# Patient Record
Sex: Male | Born: 2000 | Race: Black or African American | Hispanic: No | Marital: Single | Smoking: Never smoker
Health system: Southern US, Community
[De-identification: ages and names within clinical notes are randomized; demographics above are authoritative.]

## PROBLEM LIST (undated history)

## (undated) HISTORY — PX: OTHER SURGICAL HISTORY: SHX169

## (undated) HISTORY — PX: EYE SURGERY: SHX253

---
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
## 2022-03-17 ENCOUNTER — Ambulatory Visit (HOSPITAL_COMMUNITY)
Admission: EM | Admit: 2022-03-17 | Discharge: 2022-03-17 | Disposition: A | Discharge status: Home / Self Care | Payer: Medicaid Other | Attending: Internal Medicine | Admitting: Internal Medicine

## 2022-03-17 ENCOUNTER — Encounter (HOSPITAL_COMMUNITY): Payer: Self-pay | Admitting: Emergency Medicine

## 2022-03-17 DIAGNOSIS — Z202 Contact with and (suspected) exposure to infections with a predominantly sexual mode of transmission: Secondary | ICD-10-CM | POA: Insufficient documentation

## 2022-03-17 DIAGNOSIS — R369 Urethral discharge, unspecified: Secondary | ICD-10-CM | POA: Diagnosis not present

## 2022-03-17 DIAGNOSIS — Z113 Encounter for screening for infections with a predominantly sexual mode of transmission: Secondary | ICD-10-CM | POA: Insufficient documentation

## 2022-03-17 DIAGNOSIS — R300 Dysuria: Secondary | ICD-10-CM | POA: Diagnosis not present

## 2022-03-17 LAB — POCT URINALYSIS DIPSTICK, ED / UC
Bilirubin Urine: NEGATIVE
Glucose, UA: NEGATIVE mg/dL
Hgb urine dipstick: NEGATIVE
Ketones, ur: NEGATIVE mg/dL
Leukocytes,Ua: NEGATIVE
Nitrite: NEGATIVE
Protein, ur: 30 mg/dL — AB
Specific Gravity, Urine: >=1.03 (ref 1.005–1.030)
Urobilinogen, UA: 2 mg/dL — ABNORMAL HIGH (ref 0.0–1.0)
pH: 6 (ref 5.0–8.0)

## 2022-03-17 MED ORDER — DOXYCYCLINE HYCLATE 100 MG PO CAPS: 100.0000 mg | ORAL_CAPSULE | Freq: Two times a day (BID) | ORAL | 0 refills | Status: DC

## 2022-03-17 NOTE — ED Provider Notes (Signed)
?[facility] ? ? ? ?CSN: [id number] ?Arrival date & time: 03/17/22  1017 ? ? ?  ? ?History   ?Chief Complaint ?Chief Complaint  ?Patient presents with  ? Penile Discharge  ? Dysuria  ? ? ?HPI ?Phillip Donaldson is a 21 y.o. male.  ? ?Patient presents with penile discharge and urinary burning that has been present for a few days.  Penile discharge is clear in color.  He has recently been exposed to chlamydia from a recent sexual partner a few weeks prior.  Denies testicular pain, abdominal pain, back pain, fever. ? ? ?Penile Discharge ? ?Dysuria ? ?History reviewed. No pertinent past medical history. ? ?There are no problems to display for this patient. ? ? ?Past Surgical History:  ?Procedure Laterality Date  ? EYE SURGERY    ? Left eye    ? ? ? ? ? ?Home Medications   ? ?Prior to Admission medications   ?Medication Sig Start Date End Date Taking? Authorizing Provider  ?doxycycline (VIBRAMYCIN) 100 MG capsule Take 1 capsule (100 mg total) by mouth 2 (two) times daily. 03/17/22  Yes Gustavus Bryant, FNP  ?cyclobenzaprine (FLEXERIL) 10 MG tablet Take 1 tablet (10 mg total) by mouth 2 (two) times daily as needed for muscle spasms. ?Patient not taking: Reported on 05/28/2020 10/02/18   Niel Hummer, MD  ?HYDROcodone-acetaminophen (NORCO/VICODIN) 5-325 MG tablet Take 1 tablet by mouth every 6 (six) hours as needed for moderate pain. 07/20/21   Garlon Hatchet, PA-C  ?ibuprofen (ADVIL) 800 MG tablet Take 1 tablet (800 mg total) by mouth 3 (three) times daily. 07/20/21   Garlon Hatchet, PA-C  ?ondansetron (ZOFRAN-ODT) 4 MG disintegrating tablet Take 1 tablet (4 mg total) by mouth every 8 (eight) hours as needed for nausea or vomiting. 11/13/20   Orma Flaming, NP  ?pantoprazole (PROTONIX) 20 MG tablet Take 1 tablet (20 mg total) by mouth daily for 14 days. 05/28/20 06/11/20  Sabino Donovan, MD  ? ? ?Family History ?Family History  ?Family history unknown: Yes  ? ? ?Social History ?Social History  ? ?Tobacco Use  ? Smoking status:  Never  ? Smokeless tobacco: Never  ?Substance Use Topics  ? Alcohol use: No  ? Drug use: No  ? ? ? ?Allergies   ?Patient has no known allergies. ? ? ?Review of Systems ?Review of Systems ?Per HPI ? ?Physical Exam ?Triage Vital Signs ?ED Triage Vitals  ?Enc Vitals Group  ?   BP 03/17/22 1122 119/76  ?   Pulse Rate 03/17/22 1122 75  ?   Resp 03/17/22 1122 17  ?   Temp 03/17/22 1122 98 ?F (36.7 ?C)  ?   Temp src --   ?   SpO2 03/17/22 1122 98 %  ?   Weight --   ?   Height --   ?   Head Circumference --   ?   Peak Flow --   ?   Pain Score 03/17/22 1121 0  ?   Pain Loc --   ?   Pain Edu? --   ?   Excl. in GC? --   ? ?No data found. ? ?Updated Vital Signs ?BP 119/76   Pulse 75   Temp 98 ?F (36.7 ?C)   Resp 17   SpO2 98%  ? ?Visual Acuity ?Right Eye Distance:   ?Left Eye Distance:   ?Bilateral Distance:   ? ?Right Eye Near:   ?Left Eye Near:    ?  Bilateral Near:    ? ?Physical Exam ?Constitutional:   ?   General: He is not in acute distress. ?   Appearance: Normal appearance. He is not toxic-appearing or diaphoretic.  ?HENT:  ?   Head: Normocephalic and atraumatic.  ?Eyes:  ?   Extraocular Movements: Extraocular movements intact.  ?   Conjunctiva/sclera: Conjunctivae normal.  ?Pulmonary:  ?   Effort: Pulmonary effort is normal.  ?Genitourinary: ?   Comments: Deferred with shared decision making.  Self swab performed. ?Neurological:  ?   General: No focal deficit present.  ?   Mental Status: He is alert and oriented to person, place, and time. Mental status is at baseline.  ?Psychiatric:     ?   Mood and Affect: Mood normal.     ?   Behavior: Behavior normal.     ?   Thought Content: Thought content normal.     ?   Judgment: Judgment normal.  ? ? ? ?UC Treatments / Results  ?Labs ?(all labs ordered are listed, but only abnormal results are displayed) ?Labs Reviewed  ?POCT URINALYSIS DIPSTICK, ED / UC - Abnormal; Notable for the following components:  ?    Result Value  ? Protein, ur 30 (*)   ? Urobilinogen, UA 2.0 (*)    ? All other components within normal limits  ?CYTOLOGY, (ORAL, ANAL, URETHRAL) ANCILLARY ONLY  ? ? ?EKG ? ? ?Radiology ?No results found. ? ?Procedures ?Procedures (including critical care time) ? ?Medications Ordered in UC ?Medications - No data to display ? ?Initial Impression / Assessment and Plan / UC Course  ?I have reviewed the triage vital signs and the nursing notes. ? ?Pertinent labs & imaging results that were available during my care of the patient were reviewed by me and considered in my medical decision making (see chart for details). ? ?  ? ?Urinalysis not showing urinary tract infection.  Low suspicion for this as patient has recently been exposed to chlamydia.  Cytology swab pending.  Will prophylactically treat with doxycycline given recent chlamydia exposure.  Urinalysis indicating possible low water intake but no other worrisome concerns at this time.  Patient to refrain from sexual activity until test results and treatment are complete.  Patient verbalized understanding and was agreeable with plan. ?Final Clinical Impressions(s) / UC Diagnoses  ? ?Final diagnoses:  ?Dysuria  ?Penile discharge  ?STD exposure  ?Screening examination for venereal disease  ? ? ? ?Discharge Instructions   ? ?  ?You have been prophylactically treated for chlamydia exposure with doxycycline antibiotic.  Please take this medication with food.  We will call if your penile swab is positive.  Please refrain from sexual activity until test results and treatment are complete. ? ? ? ?ED Prescriptions   ? ? Medication Sig Dispense Auth. Provider  ? doxycycline (VIBRAMYCIN) 100 MG capsule Take 1 capsule (100 mg total) by mouth 2 (two) times daily. 20 capsule Ervin Knack E, [place]  ? ?  ? ?PDMP not reviewed this encounter. ?  ?Gustavus Bryant, [place] ?03/17/22 1153 ? ?

## 2022-03-17 NOTE — Discharge Instructions (Addendum)
You have been prophylactically treated for chlamydia exposure with doxycycline antibiotic.  Please take this medication with food.  We will call if your penile swab is positive.  Please refrain from sexual activity until test results and treatment are complete. ?

## 2022-03-17 NOTE — ED Triage Notes (Signed)
Pt is present today with concerns for penile discharge and dysuria. Pt states he cannot remember when the penile discharge started but the dysuria started today. Pt recently exposed to chlamydia.  ?

## 2022-03-18 LAB — CYTOLOGY, (ORAL, ANAL, URETHRAL) ANCILLARY ONLY
Chlamydia: POSITIVE — AB
Comment: NEGATIVE
Comment: NEGATIVE
Comment: NORMAL
Neisseria Gonorrhea: NEGATIVE
Trichomonas: NEGATIVE

## 2022-04-04 ENCOUNTER — Emergency Department (HOSPITAL_COMMUNITY): Payer: Medicaid Other

## 2022-04-04 ENCOUNTER — Encounter (HOSPITAL_COMMUNITY): Payer: Self-pay

## 2022-04-04 ENCOUNTER — Emergency Department (HOSPITAL_COMMUNITY)
Admission: EM | Admit: 2022-04-04 | Discharge: 2022-04-04 | Disposition: A | Discharge status: Home / Self Care | Payer: Medicaid Other | Attending: Emergency Medicine | Admitting: Emergency Medicine

## 2022-04-04 DIAGNOSIS — R059 Cough, unspecified: Secondary | ICD-10-CM | POA: Insufficient documentation

## 2022-04-04 DIAGNOSIS — M791 Myalgia, unspecified site: Secondary | ICD-10-CM | POA: Insufficient documentation

## 2022-04-04 DIAGNOSIS — R112 Nausea with vomiting, unspecified: Secondary | ICD-10-CM | POA: Insufficient documentation

## 2022-04-04 DIAGNOSIS — R21 Rash and other nonspecific skin eruption: Secondary | ICD-10-CM | POA: Diagnosis not present

## 2022-04-04 DIAGNOSIS — M545 Low back pain, unspecified: Secondary | ICD-10-CM

## 2022-04-04 DIAGNOSIS — M546 Pain in thoracic spine: Secondary | ICD-10-CM | POA: Insufficient documentation

## 2022-04-04 DIAGNOSIS — Z79899 Other long term (current) drug therapy: Secondary | ICD-10-CM | POA: Diagnosis not present

## 2022-04-04 DIAGNOSIS — M79606 Pain in leg, unspecified: Secondary | ICD-10-CM | POA: Diagnosis not present

## 2022-04-04 DIAGNOSIS — R079 Chest pain, unspecified: Secondary | ICD-10-CM | POA: Insufficient documentation

## 2022-04-04 DIAGNOSIS — R109 Unspecified abdominal pain: Secondary | ICD-10-CM | POA: Insufficient documentation

## 2022-04-04 LAB — URINALYSIS, ROUTINE W REFLEX MICROSCOPIC
Bilirubin Urine: NEGATIVE
Glucose, UA: NEGATIVE mg/dL
Hgb urine dipstick: NEGATIVE
Ketones, ur: NEGATIVE mg/dL
Leukocytes,Ua: NEGATIVE
Nitrite: NEGATIVE
Protein, ur: NEGATIVE mg/dL
Specific Gravity, Urine: 1.01 (ref 1.005–1.030)
pH: 5 (ref 5.0–8.0)

## 2022-04-04 LAB — CBC WITH DIFFERENTIAL/PLATELET
Abs Immature Granulocytes: 0.01 10*3/uL (ref 0.00–0.07)
Basophils Absolute: 0 10*3/uL (ref 0.0–0.1)
Basophils Relative: 0 %
Eosinophils Absolute: 0.1 10*3/uL (ref 0.0–0.5)
Eosinophils Relative: 1 %
HCT: 45.1 % (ref 39.0–52.0)
Hemoglobin: 15.2 g/dL (ref 13.0–17.0)
Immature Granulocytes: 0 %
Lymphocytes Relative: 12 %
Lymphs Abs: 0.8 10*3/uL (ref 0.7–4.0)
MCH: 29.5 pg (ref 26.0–34.0)
MCHC: 33.7 g/dL (ref 30.0–36.0)
MCV: 87.6 fL (ref 80.0–100.0)
Monocytes Absolute: 0.6 10*3/uL (ref 0.1–1.0)
Monocytes Relative: 9 %
Neutro Abs: 5.1 10*3/uL (ref 1.7–7.7)
Neutrophils Relative %: 78 %
Platelets: 178 10*3/uL (ref 150–400)
RBC: 5.15 MIL/uL (ref 4.22–5.81)
RDW: 13.8 % (ref 11.5–15.5)
WBC: 6.6 10*3/uL (ref 4.0–10.5)
nRBC: 0 % (ref 0.0–0.2)

## 2022-04-04 LAB — COMPREHENSIVE METABOLIC PANEL
ALT: 41 U/L (ref 0–44)
AST: 24 U/L (ref 15–41)
Albumin: 4.2 g/dL (ref 3.5–5.0)
Alkaline Phosphatase: 54 U/L (ref 38–126)
Anion gap: 8 (ref 5–15)
BUN: 11 mg/dL (ref 6–20)
CO2: 24 mmol/L (ref 22–32)
Calcium: 9.2 mg/dL (ref 8.9–10.3)
Chloride: 107 mmol/L (ref 98–111)
Creatinine, Ser: 1.13 mg/dL (ref 0.61–1.24)
GFR, Estimated: >60 mL/min (ref >60)
Glucose, Bld: 101 mg/dL — ABNORMAL HIGH (ref 70–99)
Potassium: 3.7 mmol/L (ref 3.5–5.1)
Sodium: 139 mmol/L (ref 135–145)
Total Bilirubin: 0.9 mg/dL (ref 0.3–1.2)
Total Protein: 8 g/dL (ref 6.5–8.1)

## 2022-04-04 LAB — RAPID URINE DRUG SCREEN, HOSP PERFORMED
Amphetamines: POSITIVE — AB
Barbiturates: NOT DETECTED
Benzodiazepines: NOT DETECTED
Cocaine: NOT DETECTED
Opiates: NOT DETECTED
Tetrahydrocannabinol: POSITIVE — AB

## 2022-04-04 LAB — LIPASE, BLOOD: Lipase: 28 U/L (ref 11–51)

## 2022-04-04 LAB — CK: Total CK: 179 U/L (ref 49–397)

## 2022-04-04 MED ORDER — SODIUM CHLORIDE 0.9 % IV BOLUS
1000.0000 mL | Freq: Once | INTRAVENOUS | Status: AC
  Administered 2022-04-04: 1000 mL via INTRAVENOUS

## 2022-04-04 MED ORDER — ONDANSETRON 4 MG PO TBDP
4.0000 mg | ORAL_TABLET | Freq: Once | ORAL | Status: AC
  Administered 2022-04-04: 4 mg via ORAL
  Filled 2022-04-04: qty 1

## 2022-04-04 MED ORDER — ONDANSETRON 4 MG PO TBDP: 4.0000 mg | ORAL_TABLET | Freq: Three times a day (TID) | ORAL | 0 refills | Status: DC | PRN

## 2022-04-04 MED ORDER — IOHEXOL 300 MG/ML  SOLN
100.0000 mL | Freq: Once | INTRAMUSCULAR | Status: AC | PRN
  Administered 2022-04-04: 80 mL via INTRAVENOUS

## 2022-04-04 MED ORDER — KETOROLAC TROMETHAMINE 30 MG/ML IJ SOLN
30.0000 mg | Freq: Once | INTRAMUSCULAR | Status: AC
  Administered 2022-04-04: 30 mg via INTRAVENOUS
  Filled 2022-04-04: qty 1

## 2022-04-04 MED ORDER — PANTOPRAZOLE SODIUM 40 MG IV SOLR
40.0000 mg | Freq: Once | INTRAVENOUS | Status: AC
  Administered 2022-04-04: 40 mg via INTRAVENOUS
  Filled 2022-04-04: qty 10

## 2022-04-04 NOTE — ED Triage Notes (Addendum)
Per EMS, patient from home, c/o SOB with vomiting and back pain for two months. Denies fevers. EMS reports refused IV access for Zofran administration.

## 2022-04-04 NOTE — ED Notes (Signed)
Pt family member states she brought pt to the ED today because he has had back pain and fatigue for years, spots on his back and neck, decreased PO intake and that he just does not feel well. Pt not cooperative in answering questions.

## 2022-04-04 NOTE — ED Notes (Signed)
Pt unable to provide urine sample at this time 

## 2022-04-04 NOTE — ED Provider Notes (Signed)
[facility]-EMERGENCY DEPT Provider Note   CSN: [id number] Arrival date & time: 04/04/22  1306     History {Add pertinent medical, surgical, social history, OB history to HPI:1} Chief Complaint  Patient presents with   Cough    Flu like sx   Back Pain    Phillip Donaldson is a 21 y.o. male.  He is brought in by his mother for evaluation of generalized body pain, back pain abdominal pain nausea vomiting pain in his legs that been going on for years.  He is not participating much in the history.  She says he has good days and bad days and this was a bad day so she called the ambulance to bring him appear.  No known fevers or chills.  He does smoke marijuana denies any injection drugs.  Does not have a doctor and does not take any medications regularly.  She said he is able to hold down a job.  He rates his generalized pain as 8 out of 10.  Has tried nothing for it  The history is provided by the patient.  Back Pain Location:  Generalized Pain severity:  Severe Pain is:  Same all the time Onset quality:  Gradual Duration: Years. Timing:  Intermittent Progression:  Unchanged Context: not recent injury   Relieved by:  Nothing Associated symptoms: abdominal pain, chest pain and leg pain   Associated symptoms: no fever   Abdominal pain:    Location:  Generalized   Timing:  Intermittent   Progression:  Unchanged     Home Medications Prior to Admission medications   Medication Sig Start Date End Date Taking? Authorizing Provider  cyclobenzaprine (FLEXERIL) 10 MG tablet Take 1 tablet (10 mg total) by mouth 2 (two) times daily as needed for muscle spasms. Patient not taking: Reported on 05/28/2020 10/02/18   Niel HummerKuhner, Ross, MD  doxycycline (VIBRAMYCIN) 100 MG capsule Take 1 capsule (100 mg total) by mouth 2 (two) times daily. 03/17/22   Gustavus BryantMound, Haley E, FNP  HYDROcodone-acetaminophen (NORCO/VICODIN) 5-325 MG tablet Take 1 tablet by mouth every 6 (six) hours as needed for  moderate pain. 07/20/21   Garlon HatchetSanders, Lisa M, PA-C  ibuprofen (ADVIL) 800 MG tablet Take 1 tablet (800 mg total) by mouth 3 (three) times daily. 07/20/21   Garlon HatchetSanders, Lisa M, PA-C  ondansetron (ZOFRAN-ODT) 4 MG disintegrating tablet Take 1 tablet (4 mg total) by mouth every 8 (eight) hours as needed for nausea or vomiting. 11/13/20   Orma FlamingHouk, Taylor R, NP  pantoprazole (PROTONIX) 20 MG tablet Take 1 tablet (20 mg total) by mouth daily for 14 days. 05/28/20 06/11/20  Sabino DonovanKatz, Eric C, MD      Allergies    Patient has no known allergies.    Review of Systems   Review of Systems  Constitutional:  Positive for fatigue. Negative for fever.  Eyes:  Negative for visual disturbance.  Respiratory:  Positive for shortness of breath.   Cardiovascular:  Positive for chest pain.  Gastrointestinal:  Positive for abdominal pain, nausea and vomiting.  Musculoskeletal:  Positive for back pain.  Skin:  Positive for rash.  Neurological:  Negative for seizures.   Physical Exam Updated Vital Signs BP 95/60   Pulse 74   Temp 98.6 F (37 C) (Oral)   Resp 18   SpO2 99%  Physical Exam Vitals and nursing note reviewed.  Constitutional:      General: He is not in acute distress.    Appearance: Normal appearance. He is  well-developed.  HENT:     Head: Normocephalic and atraumatic.  Eyes:     Conjunctiva/sclera: Conjunctivae normal.  Cardiovascular:     Rate and Rhythm: Normal rate and regular rhythm.     Heart sounds: No murmur heard. Pulmonary:     Effort: Pulmonary effort is normal. No respiratory distress.     Breath sounds: Normal breath sounds.  Abdominal:     Palpations: Abdomen is soft.     Tenderness: There is no abdominal tenderness. There is no guarding or rebound.  Musculoskeletal:        General: No deformity. Normal range of motion.     Cervical back: Neck supple.     Right lower leg: No edema.     Left lower leg: No edema.  Skin:    General: Skin is warm and dry.     Capillary Refill: Capillary  refill takes less than 2 seconds.     Findings: Rash present.     Comments: He has multiple dark patches over his neck and trunk.  Neurological:     General: No focal deficit present.    ED Results / Procedures / Treatments   Labs (all labs ordered are listed, but only abnormal results are displayed) Labs Reviewed  COMPREHENSIVE METABOLIC PANEL - Abnormal; Notable for the following components:      Result Value   Glucose, Bld 101 (*)    All other components within normal limits  CBC WITH DIFFERENTIAL/PLATELET  URINALYSIS, ROUTINE W REFLEX MICROSCOPIC    EKG None  Radiology DG Chest 2 View  Result Date: 04/04/2022 CLINICAL DATA:  Shortness of breath. Chest and back pain. Vomiting. EXAM: CHEST - 2 VIEW COMPARISON:  10/01/2018 FINDINGS: The heart size and mediastinal contours are within normal limits. Both lungs are clear. The visualized skeletal structures are unremarkable. IMPRESSION: Normal exam. Electronically Signed   By: Danae Orleans M.D.   On: 04/04/2022 13:57   CT Thoracic Spine Wo Contrast  Result Date: 04/04/2022 CLINICAL DATA:  Mid back pain, infection suspected. EXAM: CT THORACIC SPINE WITHOUT CONTRAST TECHNIQUE: Multidetector CT images of the thoracic were obtained using the standard protocol without intravenous contrast. RADIATION DOSE REDUCTION: This exam was performed according to the departmental dose-optimization program which includes automated exposure control, adjustment of the mA and/or kV according to patient size and/or use of iterative reconstruction technique. COMPARISON:  None Available. FINDINGS: Alignment: Normal. Vertebrae: No acute fracture or focal pathologic process. Paraspinal and other soft tissues: Negative. Disc levels: Disc spaces are maintained. No evidence of discitis osteomyelitis. No spinal canal or neural foraminal stenosis. IMPRESSION: Normal examination. Electronically Signed   By: Larose Hires D.O.   On: 04/04/2022 16:30   CT Lumbar Spine Wo  Contrast  Result Date: 04/04/2022 CLINICAL DATA:  Low back pain, infection suspected. No prior imaging. EXAM: CT LUMBAR SPINE WITHOUT CONTRAST TECHNIQUE: Multidetector CT imaging of the lumbar spine was performed without intravenous contrast administration. Multiplanar CT image reconstructions were also generated. RADIATION DOSE REDUCTION: This exam was performed according to the departmental dose-optimization program which includes automated exposure control, adjustment of the mA and/or kV according to patient size and/or use of iterative reconstruction technique. COMPARISON:  None Available. FINDINGS: Evaluation of the osseous structures and soft tissues is significantly limited at lumbosacral junction due to motion. Segmentation: 5 lumbar type vertebrae. Alignment: Normal. Vertebrae: No acute fracture or focal pathologic process. Evaluation at the lumbosacral junction is significantly limited due to motion. Paraspinal and other soft tissues: Negative.  Disc levels: No significant degenerate disc disease. IMPRESSION: 1. Evaluation of osseous structures and soft tissues is significantly limited at lumbosacral junction. If there is localized pain or clinical concern, repeat examination could be considered for further evaluation. 2.  No appreciable acute abnormality of the lumbar spine. Electronically Signed   By: Larose Hires D.O.   On: 04/04/2022 16:36    Procedures Procedures  {Document cardiac monitor, telemetry assessment procedure when appropriate:1}  Medications Ordered in ED Medications  sodium chloride 0.9 % bolus 1,000 mL (has no administration in time range)  pantoprazole (PROTONIX) injection 40 mg (has no administration in time range)  ondansetron (ZOFRAN-ODT) disintegrating tablet 4 mg (4 mg Oral Given 04/04/22 1412)    ED Course/ Medical Decision Making/ A&P                           Medical Decision Making Amount and/or Complexity of Data Reviewed Labs: ordered. Radiology:  ordered.  Risk Prescription drug management.  This patient complains of ***; this involves an extensive number of treatment Options and is a complaint that carries with it a high risk of complications and morbidity. The differential includes ***  I ordered, reviewed and interpreted labs, which included *** I ordered medication *** and reviewed PMP when indicated. I ordered imaging studies which included *** and I independently    visualized and interpreted imaging which showed *** Additional history obtained from *** Previous records obtained and reviewed *** I consulted *** and discussed lab and imaging findings and discussed disposition.  Cardiac monitoring reviewed, *** Social determinants considered, *** Critical Interventions: ***  After the interventions stated above, I reevaluated the patient and found *** Admission and further testing considered, ***    {Document critical care time when appropriate:1} {Document review of labs and clinical decision tools ie heart score, Chads2Vasc2 etc:1}  {Document your independent review of radiology images, and any outside records:1} {Document your discussion with family members, caretakers, and with consultants:1} {Document social determinants of health affecting pt's care:1} {Document your decision making why or why not admission, treatments were needed:1} Final Clinical Impression(s) / ED Diagnoses Final diagnoses:  None    Rx / DC Orders ED Discharge Orders     None

## 2022-04-04 NOTE — ED Provider Triage Note (Signed)
Emergency Medicine Provider Triage Evaluation Note  Phillip Donaldson , a 21 y.o. male  was evaluated in triage.  Pt complains of 2 to 4 weeks of mid to lower back pain followed by 1 week of abdominal pain with N/V and fever/chills.  Back pain started suddenly and increase gradually.  Was not preceded by any mechanical or traumatic injury.  Has been unable to keep down foods and fluids for almost a week.  Denies diarrhea, constipation, chest pain, shortness of breath, headaches.  Also feels nauseous when standing or walking, and complains of left-sided upper and lower extremity weakness.  Denies recent IVDU or Hx of cancer.  Hx of current marijuana use.  No Hx of abdominal, back, or recent surgeries.    Review of Systems  Positive:  Negative: As above  Physical Exam  BP 105/68   Pulse 95   Temp 98.6 F (37 C) (Oral)   Resp 18   SpO2 100%  Gen:   Awake, no distress, sitting in wheelchair and mildly ill-appearing Resp:  Normal effort, CTAB MSK:   Mild decreased strength of the left lower and left upper extremity.  Sensation intact to all extremities bilaterally.  Coordination appears grossly intact.  Unable to assess gait due to severe nausea.  Abdomen soft, tender in the large circumference around the umbilicus.  No obvious rash.  Moderate to significant midline tenderness near the T10-L3 region. Other:  No pronator drift, abnormal EOMs, or nystagmus.  Facial asymmetry, pre-existing due to eye surgery on the left eye several years ago.  CRT less than 2 seconds in all extremities bilaterally.  Medical Decision Making  Medically screening exam initiated at 1:55 PM.  Appropriate orders placed.  Thanh Herro was informed that the remainder of the evaluation will be completed by another provider, this initial triage assessment does not replace that evaluation, and the importance of remaining in the ED until their evaluation is complete.  Labs, imaging, EKG, and Zofran ordered   Cecil Cobbs,  PA-C 04/04/22 1414

## 2022-04-04 NOTE — Discharge Instructions (Signed)
You were seen in the emergency department for ongoing back pain nausea vomiting.  You had lab work that was unremarkable along with a CAT scan of your back and a CAT scan of your abdomen.  It would be important for you to establish care with a primary care doctor.  There is a number on this paperwork to try to find a primary care doctor.  We are prescribing you some nausea medication that may help your symptoms.  Your symptoms may be related to marijuana use and it may be reasonable to try to cut back and stop.  Return to the emergency department if any high fevers or worsening symptoms.

## 2022-04-05 LAB — RHEUMATOID FACTOR: Rheumatoid fact SerPl-aCnc: <10 IU/mL (ref <14.0)

## 2022-04-06 LAB — LUPUS ANTICOAGULANT PANEL
DRVVT: 44.2 s (ref 0.0–47.0)
PTT Lupus Anticoagulant: 36.6 s (ref 0.0–43.5)

## 2022-10-01 ENCOUNTER — Emergency Department (HOSPITAL_COMMUNITY)
Admission: EM | Admit: 2022-10-01 | Discharge: 2022-10-01 | Disposition: A | Discharge status: Home / Self Care | Payer: Medicaid Other | Attending: Emergency Medicine | Admitting: Emergency Medicine

## 2022-10-01 DIAGNOSIS — R519 Headache, unspecified: Secondary | ICD-10-CM | POA: Insufficient documentation

## 2022-10-01 DIAGNOSIS — M791 Myalgia, unspecified site: Secondary | ICD-10-CM | POA: Insufficient documentation

## 2022-10-01 DIAGNOSIS — R059 Cough, unspecified: Secondary | ICD-10-CM | POA: Diagnosis present

## 2022-10-01 DIAGNOSIS — R0981 Nasal congestion: Secondary | ICD-10-CM | POA: Insufficient documentation

## 2022-10-01 DIAGNOSIS — R509 Fever, unspecified: Secondary | ICD-10-CM | POA: Diagnosis not present

## 2022-10-01 DIAGNOSIS — J029 Acute pharyngitis, unspecified: Secondary | ICD-10-CM | POA: Insufficient documentation

## 2022-10-01 DIAGNOSIS — Z20822 Contact with and (suspected) exposure to covid-19: Secondary | ICD-10-CM | POA: Diagnosis not present

## 2022-10-01 LAB — GROUP A STREP BY PCR: Group A Strep by PCR: NOT DETECTED

## 2022-10-01 LAB — RESP PANEL BY RT-PCR (FLU A&B, COVID) ARPGX2
Influenza A by PCR: NEGATIVE
Influenza B by PCR: NEGATIVE
SARS Coronavirus 2 by RT PCR: NEGATIVE

## 2022-10-01 MED ORDER — IBUPROFEN 800 MG PO TABS
800.0000 mg | ORAL_TABLET | Freq: Once | ORAL | Status: AC
  Administered 2022-10-01: 800 mg via ORAL
  Filled 2022-10-01: qty 1

## 2022-10-01 NOTE — Discharge Instructions (Signed)
Evaluation of your cough, congestion sore throat was overall reassuring.  You do not have COVID, flu or strep throat.  You likely do have a viral upper respiratory infection which is causing the symptoms.  Symptoms could last up to 2 weeks.  Recommend conservative treatment at home which includes rest, good hydration and continued nutrition with balanced diet.  Recommend taking Tylenol and ibuprofen as needed for pain.  If you have new shortness of breath, chest pain, trouble swallowing or new drooling please return to the emergency department for further evaluation.

## 2022-10-01 NOTE — ED Triage Notes (Signed)
Patient complains of nasal congestion, fever, and generalized body aches that started two days ago.

## 2022-10-01 NOTE — ED Provider Notes (Signed)
[facility] EMERGENCY DEPARTMENT Provider Note   CSN: [id number] Arrival date & time: 10/01/22  2440     History  Chief Complaint  Patient presents with   Fever   HPI Phillip Donaldson is a 21 y.o. male presenting for congestion and cough.  States his symptoms started 2 days ago along with a headache, sore throat and bodyaches.  States his sore throat since subsided but symptoms persist.  Cough is productive of clear to yellow sputum.  Also endorses congestion.  Headache is only mild with a gradual onset.  Denies sick contacts and fever.  States that he has been taking ibuprofen and NyQuil for symptoms.  Persistence of his symptoms prompted his evaluation today in the emergency department.   Fever Associated symptoms: congestion, cough and headaches        Home Medications Prior to Admission medications   Medication Sig Start Date End Date Taking? Authorizing Provider  cyclobenzaprine (FLEXERIL) 10 MG tablet Take 1 tablet (10 mg total) by mouth 2 (two) times daily as needed for muscle spasms. Patient not taking: Reported on 05/28/2020 10/02/18   Niel Hummer, MD  doxycycline (VIBRAMYCIN) 100 MG capsule Take 1 capsule (100 mg total) by mouth 2 (two) times daily. 03/17/22   Gustavus Bryant, FNP  HYDROcodone-acetaminophen (NORCO/VICODIN) 5-325 MG tablet Take 1 tablet by mouth every 6 (six) hours as needed for moderate pain. 07/20/21   Garlon Hatchet, PA-C  ibuprofen (ADVIL) 800 MG tablet Take 1 tablet (800 mg total) by mouth 3 (three) times daily. 07/20/21   Garlon Hatchet, PA-C  ondansetron (ZOFRAN-ODT) 4 MG disintegrating tablet Take 1 tablet (4 mg total) by mouth every 8 (eight) hours as needed for nausea or vomiting. 04/04/22   Terrilee Files, MD  pantoprazole (PROTONIX) 20 MG tablet Take 1 tablet (20 mg total) by mouth daily for 14 days. 05/28/20 06/11/20  Sabino Donovan, MD      Allergies    Patient has no known allergies.    Review of Systems   Review of Systems   HENT:  Positive for congestion.   Respiratory:  Positive for cough.   Neurological:  Positive for headaches.    Physical Exam Updated Vital Signs BP (!) 124/99 (BP Location: Right Arm)   Pulse 84   Temp 99.1 F (37.3 C) (Oral)   Resp 16   SpO2 100%  Physical Exam Vitals and nursing note reviewed.  HENT:     Head: Normocephalic and atraumatic.     Mouth/Throat:     Mouth: Mucous membranes are moist.     Pharynx: Pharyngeal swelling, posterior oropharyngeal erythema and uvula swelling present.  Eyes:     General:        Right eye: No discharge.        Left eye: No discharge.     Conjunctiva/sclera: Conjunctivae normal.  Cardiovascular:     Rate and Rhythm: Normal rate and regular rhythm.     Pulses: Normal pulses.     Heart sounds: Normal heart sounds.  Pulmonary:     Effort: Pulmonary effort is normal.     Breath sounds: Normal breath sounds. No decreased breath sounds, wheezing, rhonchi or rales.  Abdominal:     General: Abdomen is flat.     Palpations: Abdomen is soft.  Skin:    General: Skin is warm and dry.  Neurological:     General: No focal deficit present.  Psychiatric:  Mood and Affect: Mood normal.     ED Results / Procedures / Treatments   Labs (all labs ordered are listed, but only abnormal results are displayed) Labs Reviewed  RESP PANEL BY RT-PCR (FLU A&B, COVID) [id number]  GROUP A STREP BY PCR    EKG None  Radiology No results found.  Procedures Procedures    Medications Ordered in ED Medications  ibuprofen (ADVIL) tablet 800 mg (800 mg Oral Given 10/01/22 1140)    ED Course/ Medical Decision Making/ A&P                           Medical Decision Making Risk Prescription drug management.   Patient presented for 2-day history of congestion, cough and sore throat.  Overall patient appears nontoxic and is hemodynamically stable.  Differential diagnosis for this complaint includes URI, pneumonia, and pharyngitis.  Considered  pneumonia but unlikely given no adventitious lung sounds on exam and no fever.  Considered strep pharyngitis given erythema and swelling noted in the posterior pharynx but unlikely given negative strep PCR.  Symptoms could be related to a viral pharyngitis but patient stated that his sore throat is improving.  Overall consistent with likely ongoing URI likely viral in nature.  Recommended conservative treatment at home.  Discussed return precautions and advised patient to follow-up with PCP if symptoms persisted.  Treated his pain with ibuprofen.  Patient stated he felt about the same.        Final Clinical Impression(s) / ED Diagnoses Final diagnoses:  Cough, unspecified type  Nasal congestion  Sore throat    Rx / DC Orders ED Discharge Orders     None         Gareth Eagle, PA-C 10/01/22 1240    Arby Barrette, MD 10/02/22 [phone]

## 2023-03-23 IMAGING — CT CT T SPINE W/O CM
3 series · 13 of 33 positions shown, 16 images · non-contrast
Comparison: None Available.

CLINICAL DATA: Mid back pain, infection suspected.

EXAM:
CT THORACIC SPINE WITHOUT CONTRAST
TECHNIQUE: Multidetector CT images of the thoracic were obtained using the
standard protocol without intravenous contrast.
RADIATION DOSE REDUCTION: This exam was performed according to the
departmental dose-optimization program which includes automated
exposure control, adjustment of the mA and/or kV according to
patient size and/or use of iterative reconstruction technique.

[Series 4: t spine st · axial · 0.43mm/px · z∈[-276,-50]mm · 5 of 165 slices shown, 7 images]
[im 26/165  soft-tissue]
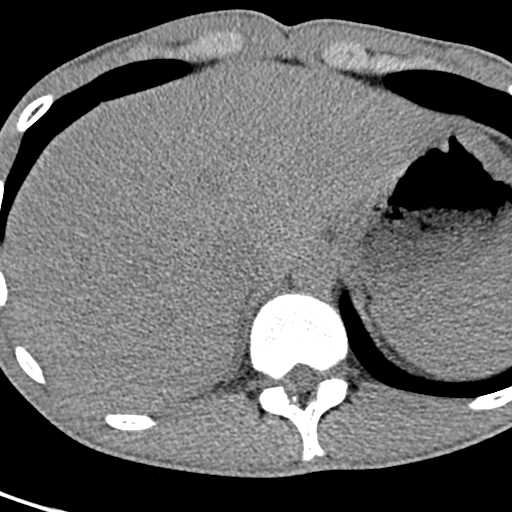
[im 26/165  bone]
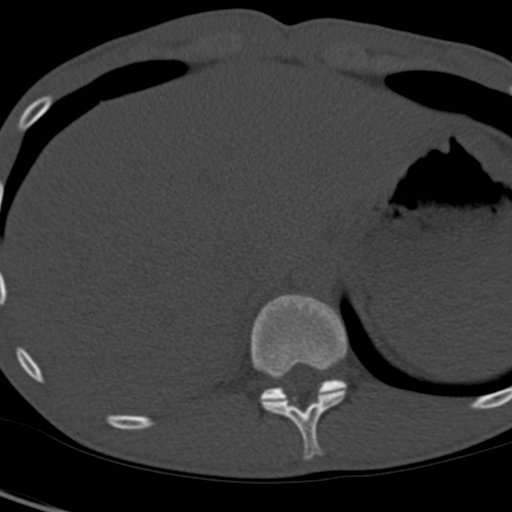
[im 51/165  bone]
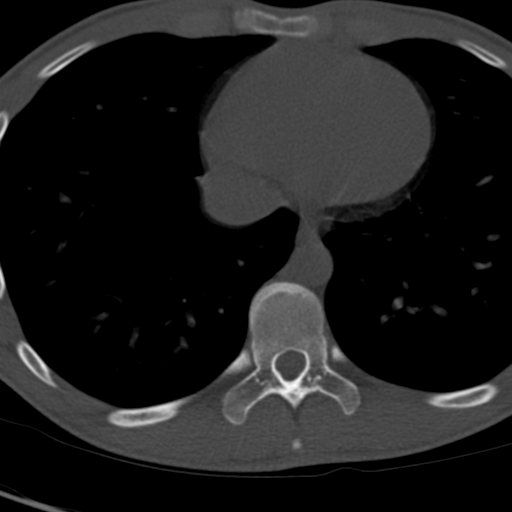
[im 89/165  bone]
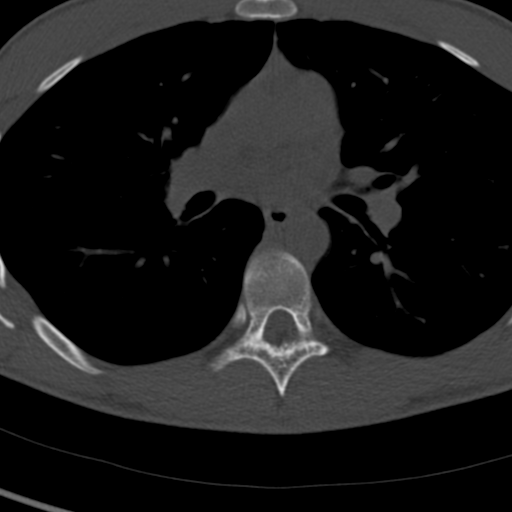
[im 114/165  bone]
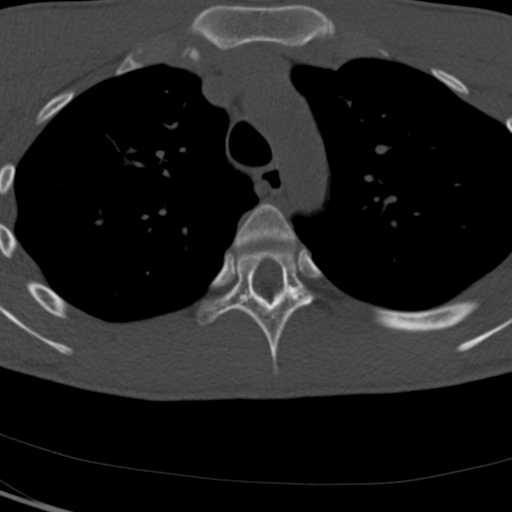
[im 139/165  soft-tissue]
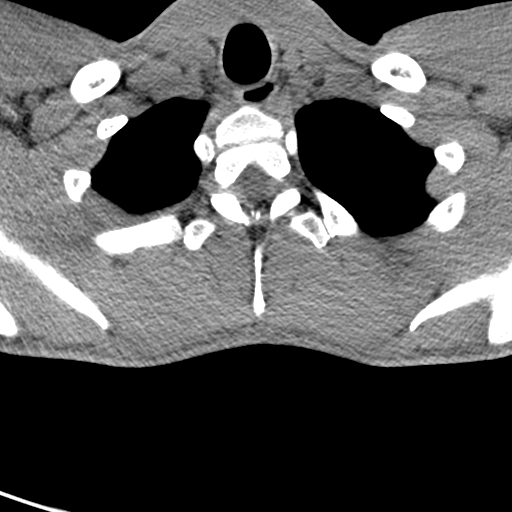
[im 139/165  bone]
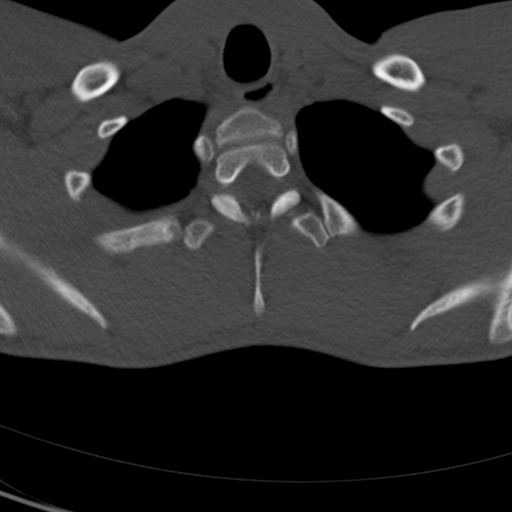

[Series 8: coronal bone · coronal · 0.48mm/px · 3 of 89 slices shown]
[im 18/89  bone]
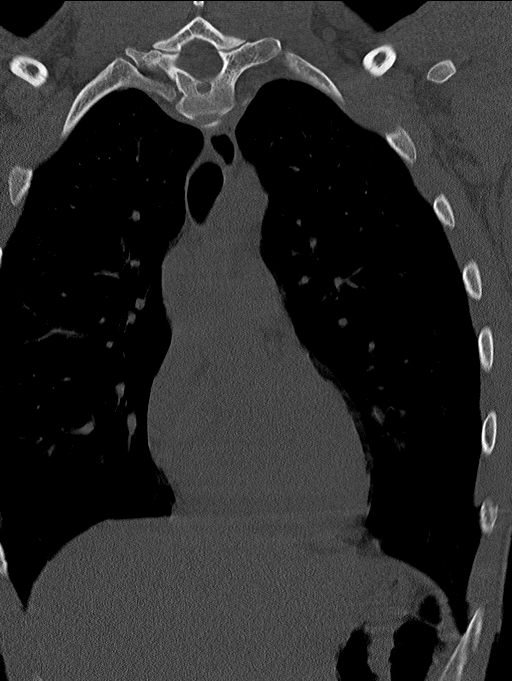
[im 36/89  bone]
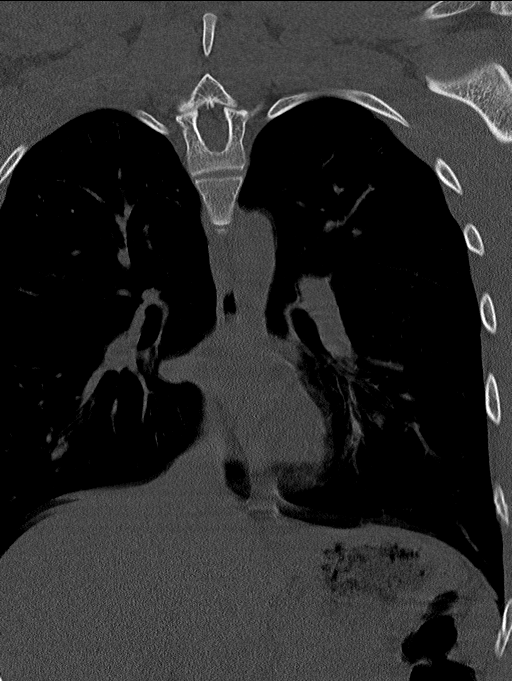
[im 53/89  bone]
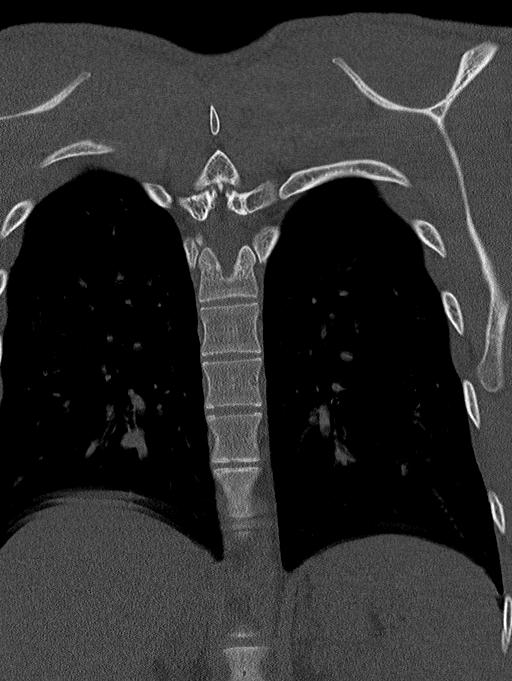

[Series 9: sagittal bone · sagittal · 0.44mm/px · 5 of 61 slices shown, 6 images]
[im 21/61  bone]
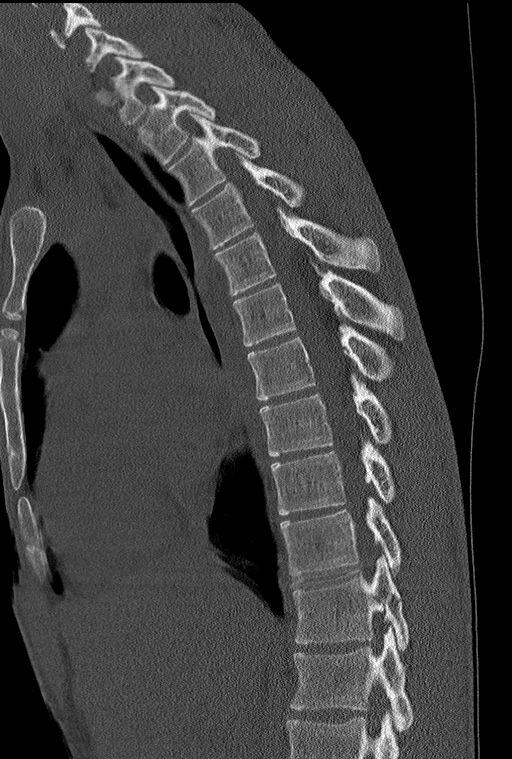
[im 26/61  bone]
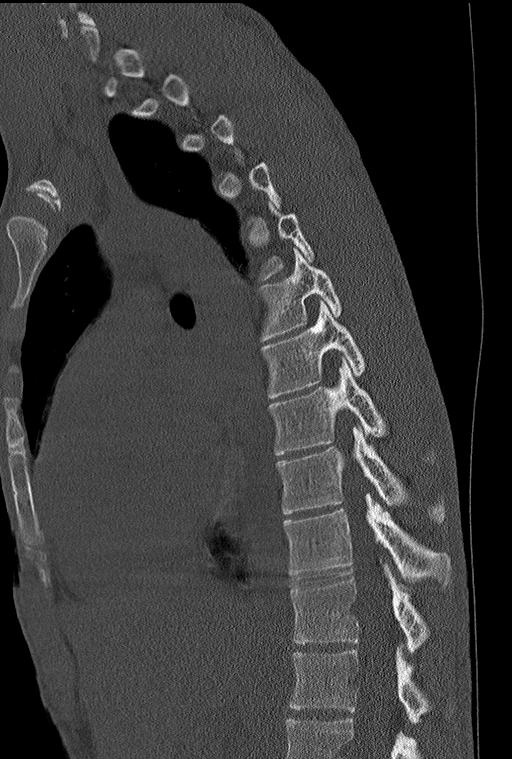
[im 31/61  soft-tissue]
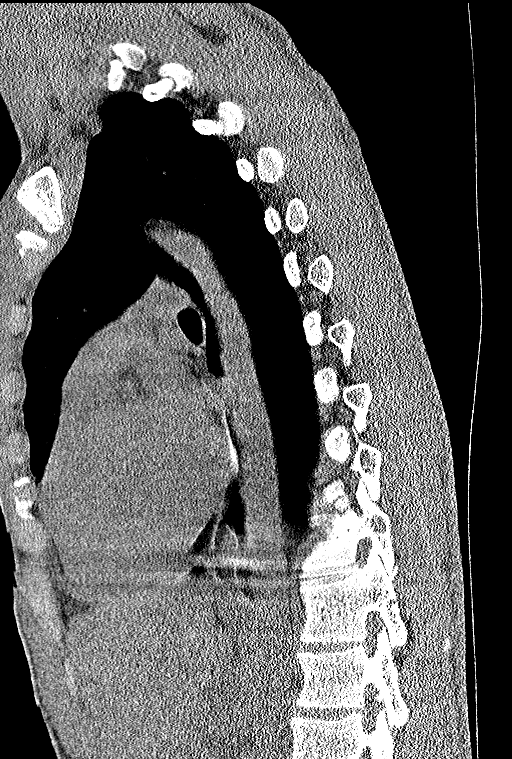
[im 31/61  bone]
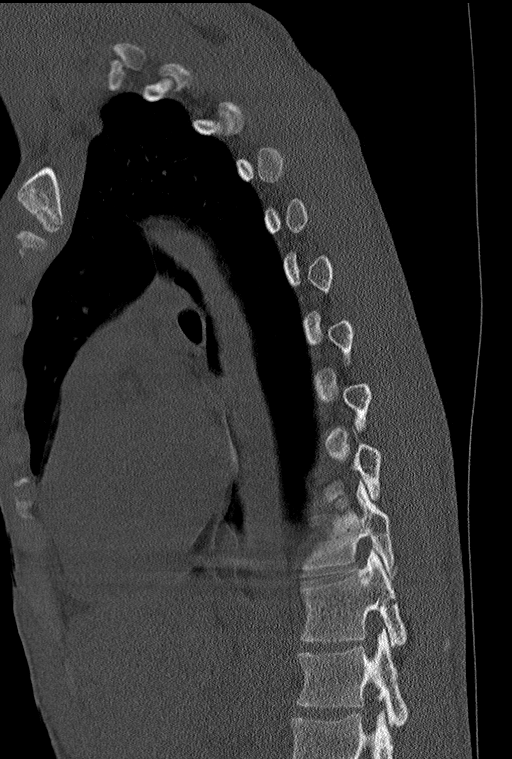
[im 36/61  bone]
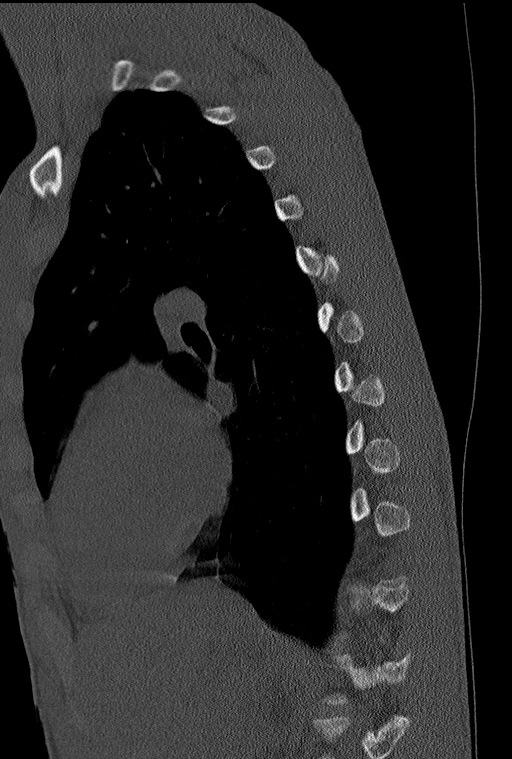
[im 41/61  bone]
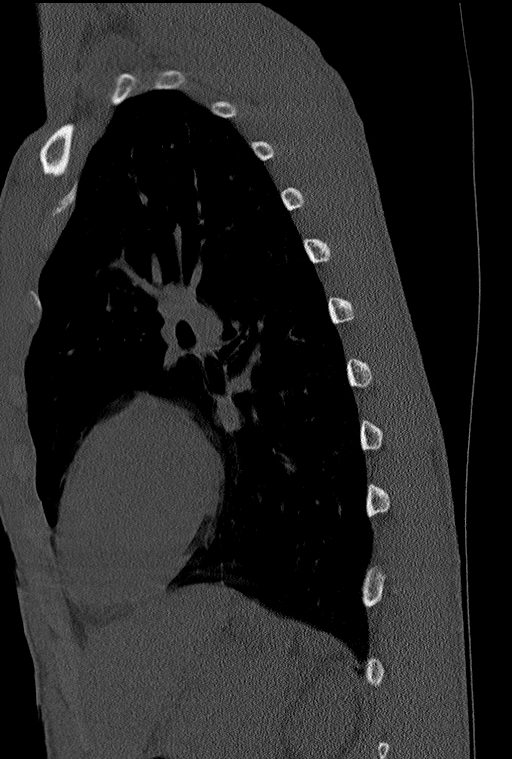

[13 of 33 positions shown; findings below may reference images not displayed]

FINDINGS: Alignment: Normal.

Vertebrae: No acute fracture or focal pathologic process.

Paraspinal and other soft tissues: Negative.

Disc levels: Disc spaces are maintained. No evidence of discitis
osteomyelitis. No spinal canal or neural foraminal stenosis.
IMPRESSION: Normal examination.

## 2023-04-23 ENCOUNTER — Encounter (HOSPITAL_COMMUNITY): Payer: Self-pay | Admitting: Emergency Medicine

## 2023-04-23 ENCOUNTER — Ambulatory Visit (HOSPITAL_COMMUNITY)
Admission: EM | Admit: 2023-04-23 | Discharge: 2023-04-23 | Disposition: A | Discharge status: Home / Self Care | Payer: Medicaid Other | Attending: Emergency Medicine | Admitting: Emergency Medicine

## 2023-04-23 DIAGNOSIS — N342 Other urethritis: Secondary | ICD-10-CM | POA: Insufficient documentation

## 2023-04-23 LAB — POCT URINALYSIS DIP (MANUAL ENTRY)
Bilirubin, UA: NEGATIVE
Glucose, UA: NEGATIVE mg/dL
Ketones, POC UA: NEGATIVE mg/dL
Leukocytes, UA: NEGATIVE
Nitrite, UA: NEGATIVE
Protein Ur, POC: >=300 mg/dL — AB
Spec Grav, UA: >=1.03 — AB (ref 1.010–1.025)
Urobilinogen, UA: 0.2 E.U./dL
pH, UA: 6 (ref 5.0–8.0)

## 2023-04-23 MED ORDER — DEXAMETHASONE SODIUM PHOSPHATE 10 MG/ML IJ SOLN
INTRAMUSCULAR | Status: AC
  Filled 2023-04-23: qty 1

## 2023-04-23 MED ORDER — DEXAMETHASONE SODIUM PHOSPHATE 10 MG/ML IJ SOLN: 10.0000 mg | Freq: Once | INTRAMUSCULAR | Status: DC

## 2023-04-23 NOTE — Discharge Instructions (Signed)
The results of your STD testing today which screens for gonorrhea, chlamydia, and trichomonas will be made posted to your MyChart account once it is complete.  This typically takes 2 to 4 days.  Please abstain from sexual intercourse of any kind, vaginal, oral or anal, until you have received the results of your STD testing.     If any of your results are abnormal, you will receive a phone call regarding treatment.  Prescriptions, if any are needed, will be provided for you at your pharmacy.     Our point-of-care analysis of your urine sample today was normal and did not reveal any concern for urinary tract infection.   If you have not had complete resolution of your symptoms after completing any recommended treatment or if your symptoms worsen, please return for repeat evaluation.  You received an injection of Decadron to relieve your symptoms of bee sting.  You are also welcome to take Benadryl 25 mg every 8 hours as needed.   Thank you for visiting urgent care today.  I appreciate the opportunity to participate in your care.

## 2023-04-23 NOTE — ED Triage Notes (Signed)
Pt reports that had dysuria and frequent urination for over week. Reports that there will be pink-red blood in urine when "hold it and then force it out".   Pt adds that he got stung by bee yesterday in right hand/arm, left leg and face. Has some redness, swelling, itching and pain to areas. Hasn't taken medications for symptoms.

## 2023-04-23 NOTE — ED Provider Notes (Signed)
[facility]    CSN: [id number] Arrival date & time: 04/23/23  1637    HISTORY   Chief Complaint  Patient presents with   Dysuria   Insect Bite   HPI Phillip Donaldson is a pleasant, 22 y.o. male who presents to urgent care today. Patient complains of burning with urination as well as increased frequency of urination for the past week.  Patient states that if he holds his urine and then forcefully voids, he has noticed "pink-red blood" in his urine.  Patient states that he is sexually active and does not regularly use condoms.  EMR reviewed, patient has a history of chlamydia.  Patient also complains of being stung by a bee yesterday while mowing the lawn on his right hand, arm, left leg and face.  Patient states he continues to have the redness, swelling, itching and pain in the areas where he was stung.  Patient states he has not taken anything to alleviate any of his symptoms.  The history is provided by the patient.   History reviewed. No pertinent past medical history. There are no problems to display for this patient.  Past Surgical History:  Procedure Laterality Date   EYE SURGERY     Left eye      Home Medications    Prior to Admission medications   Medication Sig Start Date End Date Taking? Authorizing Provider  cyclobenzaprine (FLEXERIL) 10 MG tablet Take 1 tablet (10 mg total) by mouth 2 (two) times daily as needed for muscle spasms. Patient not taking: Reported on 05/28/2020 10/02/18   Niel Hummer, MD  doxycycline (VIBRAMYCIN) 100 MG capsule Take 1 capsule (100 mg total) by mouth 2 (two) times daily. 03/17/22   Gustavus Bryant, FNP  HYDROcodone-acetaminophen (NORCO/VICODIN) 5-325 MG tablet Take 1 tablet by mouth every 6 (six) hours as needed for moderate pain. 07/20/21   Garlon Hatchet, PA-C  ibuprofen (ADVIL) 800 MG tablet Take 1 tablet (800 mg total) by mouth 3 (three) times daily. 07/20/21   Garlon Hatchet, PA-C  ondansetron (ZOFRAN-ODT) 4 MG disintegrating  tablet Take 1 tablet (4 mg total) by mouth every 8 (eight) hours as needed for nausea or vomiting. 04/04/22   Terrilee Files, MD  pantoprazole (PROTONIX) 20 MG tablet Take 1 tablet (20 mg total) by mouth daily for 14 days. 05/28/20 06/11/20  Sabino Donovan, MD    Family History Family History  Family history unknown: Yes   Social History Social History   Tobacco Use   Smoking status: Never   Smokeless tobacco: Never  Substance Use Topics   Alcohol use: No   Drug use: No   Allergies   Patient has no known allergies.  Review of Systems Review of Systems Pertinent findings revealed after performing a 14 point review of systems has been noted in the history of present illness.  Physical Exam Vital Signs BP 106/66 (BP Location: Left Arm)   Pulse 70   Temp (!) 97.5 F (36.4 C) (Oral)   Resp 17   SpO2 98%   No data found.  Physical Exam Vitals and nursing note reviewed.  Constitutional:      General: He is not in acute distress.    Appearance: Normal appearance. He is not ill-appearing.  HENT:     Head: Normocephalic and atraumatic.  Eyes:     General: Lids are normal.        Right eye: No discharge.        Left  eye: No discharge.     Extraocular Movements: Extraocular movements intact.     Conjunctiva/sclera: Conjunctivae normal.     Right eye: Right conjunctiva is not injected.     Left eye: Left conjunctiva is not injected.  Neck:     Trachea: Trachea and phonation normal.  Cardiovascular:     Rate and Rhythm: Normal rate and regular rhythm.     Pulses: Normal pulses.     Heart sounds: Normal heart sounds. No murmur heard.    No friction rub. No gallop.  Pulmonary:     Effort: Pulmonary effort is normal. No accessory muscle usage, prolonged expiration or respiratory distress.     Breath sounds: Normal breath sounds. No stridor, decreased air movement or transmitted upper airway sounds. No decreased breath sounds, wheezing, rhonchi or rales.  Chest:     Chest  wall: No tenderness.  Genitourinary:    Comments: Pt politely declines GU exam, pt did provide a penile swab for testing.   Musculoskeletal:        General: Normal range of motion.     Cervical back: Normal range of motion and neck supple. Normal range of motion.  Lymphadenopathy:     Cervical: No cervical adenopathy.  Skin:    General: Skin is warm and dry.     Findings: Erythema (Right hand and left lower leg) present. No rash.  Neurological:     General: No focal deficit present.     Mental Status: He is alert and oriented to person, place, and time.  Psychiatric:        Mood and Affect: Mood normal.        Behavior: Behavior normal.     Visual Acuity Right Eye Distance:   Left Eye Distance:   Bilateral Distance:    Right Eye Near:   Left Eye Near:    Bilateral Near:     UC Couse / Diagnostics / Procedures:     Radiology No results found.  Procedures Procedures (including critical care time) EKG  Pending results:  Labs Reviewed  POCT URINALYSIS DIP (MANUAL ENTRY) - Abnormal; Notable for the following components:      Result Value   Clarity, UA cloudy (*)    Spec Grav, UA >=1.030 (*)    Blood, UA large (*)    Protein Ur, POC >=300 (*)    All other components within normal limits  CYTOLOGY, (ORAL, ANAL, URETHRAL) ANCILLARY ONLY    Medications Ordered in UC: Medications  dexamethasone (DECADRON) injection 10 mg (has no administration in time range)    UC Diagnoses / Final Clinical Impressions(s)   I have reviewed the triage vital signs and the nursing notes.  Pertinent labs & imaging results that were available during my care of the patient were reviewed by me and considered in my medical decision making (see chart for details).    Final diagnoses:  Urethritis   Patient provided with an injection of Decadron to address swelling and itching around bee stings.  Patient also advised to take Benadryl 25 mg every 8 hours as needed.  Urinalysis is not  concerning for acute urinary tract infection.  STD screening was performed, patient advised that the results be posted to their MyChart and if any of the results are positive, they will be notified by phone, further treatment will be provided as indicated based on results of STD screening. Patient was advised to abstain from sexual intercourse until that they receive the results of their STD  testing.  Patient was also advised to use condoms to protect themselves from STD exposure.  Please see discharge instructions below for details of plan of care as provided to patient. ED Prescriptions   None    PDMP not reviewed this encounter.  Disposition Upon Discharge:  Condition: stable for discharge home  Patient presented with concern for an acute illness with associated systemic symptoms and significant discomfort requiring urgent management. In my opinion, this is a condition that a prudent lay person (someone who possesses an average knowledge of health and medicine) may potentially expect to result in complications if not addressed urgently such as respiratory distress, impairment of bodily function or dysfunction of bodily organs.   As such, the patient has been evaluated and assessed, work-up was performed and treatment was provided in alignment with urgent care protocols and evidence based medicine.  Patient/parent/caregiver has been advised that the patient may require follow up for further testing and/or treatment if the symptoms continue in spite of treatment, as clinically indicated and appropriate.  Routine symptom specific, illness specific and/or disease specific instructions were discussed with the patient and/or caregiver at length.  Prevention strategies for avoiding STD exposure were also discussed.  The patient will follow up with their current PCP if and as advised. If the patient does not currently have a PCP we will assist them in obtaining one.   The patient may need specialty  follow up if the symptoms continue, in spite of conservative treatment and management, for further workup, evaluation, consultation and treatment as clinically indicated and appropriate.  Patient/parent/caregiver verbalized understanding and agreement of plan as discussed.  All questions were addressed during visit.  Please see discharge instructions below for further details of plan.  Discharge Instructions:   Discharge Instructions      The results of your STD testing today which screens for gonorrhea, chlamydia, and trichomonas will be made posted to your MyChart account once it is complete.  This typically takes 2 to 4 days.  Please abstain from sexual intercourse of any kind, vaginal, oral or anal, until you have received the results of your STD testing.     If any of your results are abnormal, you will receive a phone call regarding treatment.  Prescriptions, if any are needed, will be provided for you at your pharmacy.     Our point-of-care analysis of your urine sample today was normal and did not reveal any concern for urinary tract infection.   If you have not had complete resolution of your symptoms after completing any recommended treatment or if your symptoms worsen, please return for repeat evaluation.  You received an injection of Decadron to relieve your symptoms of bee sting.  You are also welcome to take Benadryl 25 mg every 8 hours as needed.   Thank you for visiting urgent care today.  I appreciate the opportunity to participate in your care.       This office note has been dictated using Teaching laboratory technician.  Unfortunately, this method of dictation can sometimes lead to typographical or grammatical errors.  I apologize for your inconvenience in advance if this occurs.  Please do not hesitate to reach out to me if clarification is needed.       Theadora Rama Scales, PA-C 04/23/23 1717

## 2023-04-24 LAB — CYTOLOGY, (ORAL, ANAL, URETHRAL) ANCILLARY ONLY
Chlamydia: POSITIVE — AB
Comment: NEGATIVE
Comment: NEGATIVE
Comment: NORMAL
Neisseria Gonorrhea: NEGATIVE
Trichomonas: NEGATIVE

## 2023-04-26 ENCOUNTER — Telehealth (HOSPITAL_COMMUNITY): Payer: Self-pay | Admitting: Emergency Medicine

## 2023-04-26 MED ORDER — DOXYCYCLINE HYCLATE 100 MG PO CAPS: 100.0000 mg | ORAL_CAPSULE | Freq: Two times a day (BID) | ORAL | 0 refills | Status: AC

## 2023-07-11 ENCOUNTER — Ambulatory Visit
Admission: EM | Admit: 2023-07-11 | Discharge: 2023-07-11 | Disposition: A | Discharge status: Home / Self Care | Payer: Medicaid Other | Attending: Internal Medicine | Admitting: Internal Medicine

## 2023-07-11 DIAGNOSIS — Z113 Encounter for screening for infections with a predominantly sexual mode of transmission: Secondary | ICD-10-CM | POA: Diagnosis present

## 2023-07-11 DIAGNOSIS — R509 Fever, unspecified: Secondary | ICD-10-CM | POA: Diagnosis present

## 2023-07-11 DIAGNOSIS — U071 COVID-19: Secondary | ICD-10-CM | POA: Diagnosis not present

## 2023-07-11 DIAGNOSIS — J069 Acute upper respiratory infection, unspecified: Secondary | ICD-10-CM

## 2023-07-11 DIAGNOSIS — J029 Acute pharyngitis, unspecified: Secondary | ICD-10-CM

## 2023-07-11 LAB — POCT RAPID STREP A (OFFICE): Rapid Strep A Screen: NEGATIVE

## 2023-07-11 NOTE — ED Triage Notes (Signed)
Pt reports sore throat, fever and body aches x 3 days.   Pt requested STD's test as he used an expired condom.

## 2023-07-11 NOTE — ED Provider Notes (Signed)
[facility]    CSN: [id number] Arrival date & time: 07/11/23  1311      History   Chief Complaint Chief Complaint  Patient presents with   Cough         SEXUALLY TRANSMITTED DISEASE    HPI Phillip Donaldson is a 22 y.o. male.   Patient presents with 2 different chief complaints today.  Reports sore throat, nasal congestion, runny nose, fever, body aches that started about 3 days ago.  Denies any coughing.  Reports that he was exposed to someone that either has COVID or flu but he is not sure.  Patient is not sure of Tmax at home.  He does not report taking medications for symptoms.  Patient also requesting STD testing.  States that he recently had intercourse where the condom was expired.  denies any exposure to STD or any associated symptoms such as penile discharge, dysuria, abdominal pain.   Cough   History reviewed. No pertinent past medical history.  There are no problems to display for this patient.   Past Surgical History:  Procedure Laterality Date   EYE SURGERY     Left eye         Home Medications    Prior to Admission medications   Not on File    Family History Family History  Family history unknown: Yes    Social History Social History   Tobacco Use   Smoking status: Never   Smokeless tobacco: Never  Vaping Use   Vaping status: Never Used  Substance Use Topics   Alcohol use: No   Drug use: No     Allergies   Patient has no known allergies.   Review of Systems Review of Systems Per HPI  Physical Exam Triage Vital Signs ED Triage Vitals  Encounter Vitals Group     BP 07/11/23 1407 119/83     Systolic BP Percentile --      Diastolic BP Percentile --      Pulse Rate 07/11/23 1407 86     Resp 07/11/23 1407 16     Temp 07/11/23 1407 99.1 F (37.3 C)     Temp Source 07/11/23 1407 Oral     SpO2 07/11/23 1407 98 %     Weight --      Height --      Head Circumference --      Peak Flow --      Pain Score 07/11/23  1412 8     Pain Loc --      Pain Education --      Exclude from Growth Chart --    No data found.  Updated Vital Signs BP 119/83 (BP Location: Left Arm)   Pulse 86   Temp 99.1 F (37.3 C) (Oral)   Resp 16   SpO2 98%   Visual Acuity Right Eye Distance:   Left Eye Distance:   Bilateral Distance:    Right Eye Near:   Left Eye Near:    Bilateral Near:     Physical Exam Constitutional:      General: He is not in acute distress.    Appearance: Normal appearance. He is not toxic-appearing or diaphoretic.  HENT:     Head: Normocephalic and atraumatic.     Right Ear: Tympanic membrane and ear canal normal.     Left Ear: Tympanic membrane and ear canal normal.     Nose: Congestion present.     Mouth/Throat:     Mouth:  Mucous membranes are moist.     Pharynx: Posterior oropharyngeal erythema present.  Eyes:     Extraocular Movements: Extraocular movements intact.     Conjunctiva/sclera: Conjunctivae normal.     Pupils: Pupils are equal, round, and reactive to light.  Cardiovascular:     Rate and Rhythm: Normal rate and regular rhythm.     Pulses: Normal pulses.     Heart sounds: Normal heart sounds.  Pulmonary:     Effort: Pulmonary effort is normal. No respiratory distress.     Breath sounds: Normal breath sounds. No stridor. No wheezing, rhonchi or rales.  Abdominal:     General: Abdomen is flat. Bowel sounds are normal.     Palpations: Abdomen is soft.  Genitourinary:    Comments: Deferred with shared decision making.  Self swab performed. Musculoskeletal:        General: Normal range of motion.     Cervical back: Normal range of motion.  Skin:    General: Skin is warm and dry.  Neurological:     General: No focal deficit present.     Mental Status: He is alert and oriented to person, place, and time. Mental status is at baseline.  Psychiatric:        Mood and Affect: Mood normal.        Behavior: Behavior normal.      UC Treatments / Results  Labs (all  labs ordered are listed, but only abnormal results are displayed) Labs Reviewed  CULTURE, GROUP A STREP ([facility])  SARS CORONAVIRUS 2 (TAT 6-24 HRS)  POCT RAPID STREP A (OFFICE)  CYTOLOGY, (ORAL, ANAL, URETHRAL) ANCILLARY ONLY    EKG   Radiology No results found.  Procedures Procedures (including critical care time)  Medications Ordered in UC Medications - No data to display  Initial Impression / Assessment and Plan / UC Course  I have reviewed the triage vital signs and the nursing notes.  Pertinent labs & imaging results that were available during my care of the patient were reviewed by me and considered in my medical decision making (see chart for details).     Viral upper respiratory infection Patient presents with symptoms likely from a viral upper respiratory infection.  Do not suspect underlying cardiopulmonary process. Symptoms seem unlikely related to ACS, CHF or COPD exacerbations, pneumonia, pneumothorax. Patient is nontoxic appearing and not in need of emergent medical intervention.  Strep is negative.  Throat culture and COVID test pending.  Flu testing deferred given duration of symptoms as it would not change treatment.  Recommended symptom control with over the counter medications.  2. STD testing  STD testing pending given concern for expired condom.  Cytology swab pending but patient declined HIV and RPR.  Return if symptoms fail to improve in 1-2 weeks or you develop shortness of breath, chest pain, severe headache. Patient states understanding and is agreeable.  Discharged with PCP followup.  Final Clinical Impressions(s) / UC Diagnoses   Final diagnoses:  Viral upper respiratory infection  Sore throat  Screening examination for venereal disease     Discharge Instructions      Rapid strep is negative.  Throat culture COVID test pending.  Will call if it is positive.  It appears that you have a viral illness that should run its course.  Ensure  adequate fluid hydration and rest.  STD testing is pending.  Will call if that is positive as well.    ED Prescriptions   None  PDMP not reviewed this encounter.   Gustavus Bryant, [place] 07/11/23 [phone]

## 2023-07-11 NOTE — Discharge Instructions (Signed)
Rapid strep is negative.  Throat culture COVID test pending.  Will call if it is positive.  It appears that you have a viral illness that should run its course.  Ensure adequate fluid hydration and rest.  STD testing is pending.  Will call if that is positive as well.

## 2023-07-12 LAB — SARS CORONAVIRUS 2 (TAT 6-24 HRS): SARS Coronavirus 2: POSITIVE — AB

## 2023-07-13 ENCOUNTER — Ambulatory Visit
Admission: RE | Admit: 2023-07-13 | Discharge: 2023-07-13 | Disposition: A | Discharge status: Home / Self Care | Payer: Medicaid Other | Source: Ambulatory Visit | Attending: Physician Assistant | Admitting: Physician Assistant

## 2023-07-13 DIAGNOSIS — A549 Gonococcal infection, unspecified: Secondary | ICD-10-CM | POA: Diagnosis not present

## 2023-07-13 LAB — CYTOLOGY, (ORAL, ANAL, URETHRAL) ANCILLARY ONLY
Chlamydia: NEGATIVE
Comment: NEGATIVE
Comment: NEGATIVE
Comment: NORMAL
Neisseria Gonorrhea: POSITIVE — AB
Trichomonas: NEGATIVE

## 2023-07-13 MED ORDER — CEFTRIAXONE SODIUM 1 G IJ SOLR
1.0000 g | Freq: Once | INTRAMUSCULAR | Status: AC
  Administered 2023-07-13: 1 g via INTRAMUSCULAR

## 2023-07-13 NOTE — ED Triage Notes (Addendum)
Pt states he tested positive for gonorrhea and is here for treatment. Also tested positive for covid and is having chest pain worse when he takes a deep breath.

## 2023-07-14 LAB — CULTURE, GROUP A STREP (THRC)

## 2023-09-19 ENCOUNTER — Ambulatory Visit: Payer: Medicaid Other
